# Patient Record
Sex: Female | Born: 1990 | Race: White | Hispanic: No | Marital: Single | Smoking: Current every day smoker
Health system: Southern US, Community
[De-identification: ages and names within clinical notes are randomized; demographics above are authoritative.]

---

## 2019-05-26 ENCOUNTER — Encounter (HOSPITAL_COMMUNITY): Payer: Self-pay

## 2019-05-26 ENCOUNTER — Other Ambulatory Visit: Payer: Self-pay

## 2019-05-26 ENCOUNTER — Emergency Department (HOSPITAL_COMMUNITY): Payer: Self-pay

## 2019-05-26 ENCOUNTER — Emergency Department (HOSPITAL_COMMUNITY)
Admission: EM | Admit: 2019-05-26 | Discharge: 2019-05-27 | Disposition: A | Discharge status: Home / Self Care | Payer: Self-pay | Attending: Emergency Medicine | Admitting: Emergency Medicine

## 2019-05-26 DIAGNOSIS — F17210 Nicotine dependence, cigarettes, uncomplicated: Secondary | ICD-10-CM | POA: Insufficient documentation

## 2019-05-26 DIAGNOSIS — R030 Elevated blood-pressure reading, without diagnosis of hypertension: Secondary | ICD-10-CM | POA: Insufficient documentation

## 2019-05-26 DIAGNOSIS — R000 Tachycardia, unspecified: Secondary | ICD-10-CM | POA: Insufficient documentation

## 2019-05-26 DIAGNOSIS — I10 Essential (primary) hypertension: Secondary | ICD-10-CM

## 2019-05-26 DIAGNOSIS — Z87442 Personal history of urinary calculi: Secondary | ICD-10-CM | POA: Insufficient documentation

## 2019-05-26 DIAGNOSIS — N3001 Acute cystitis with hematuria: Secondary | ICD-10-CM | POA: Insufficient documentation

## 2019-05-26 LAB — CBC WITH DIFFERENTIAL/PLATELET
Abs Immature Granulocytes: 0.07 10*3/uL (ref 0.00–0.07)
Basophils Absolute: 0 10*3/uL (ref 0.0–0.1)
Basophils Relative: 0 %
Eosinophils Absolute: 0.1 10*3/uL (ref 0.0–0.5)
Eosinophils Relative: 0 %
HCT: 45.2 % (ref 36.0–46.0)
Hemoglobin: 13.8 g/dL (ref 12.0–15.0)
Immature Granulocytes: 0 %
Lymphocytes Relative: 11 %
Lymphs Abs: 1.8 10*3/uL (ref 0.7–4.0)
MCH: 25.6 pg — ABNORMAL LOW (ref 26.0–34.0)
MCHC: 30.5 g/dL (ref 30.0–36.0)
MCV: 83.9 fL (ref 80.0–100.0)
Monocytes Absolute: 0.8 10*3/uL (ref 0.1–1.0)
Monocytes Relative: 5 %
Neutro Abs: 14.4 10*3/uL — ABNORMAL HIGH (ref 1.7–7.7)
Neutrophils Relative %: 84 %
Platelets: 392 10*3/uL (ref 150–400)
RBC: 5.39 MIL/uL — ABNORMAL HIGH (ref 3.87–5.11)
RDW: 15.6 % — ABNORMAL HIGH (ref 11.5–15.5)
WBC: 17.2 10*3/uL — ABNORMAL HIGH (ref 4.0–10.5)
nRBC: 0 % (ref 0.0–0.2)

## 2019-05-26 LAB — URINALYSIS, ROUTINE W REFLEX MICROSCOPIC
Bilirubin Urine: NEGATIVE
Glucose, UA: NEGATIVE mg/dL
Ketones, ur: 5 mg/dL — AB
Nitrite: POSITIVE — AB
Protein, ur: 100 mg/dL — AB
RBC / HPF: >50 RBC/hpf — ABNORMAL HIGH (ref 0–5)
Specific Gravity, Urine: 1.011 (ref 1.005–1.030)
WBC, UA: >50 WBC/hpf — ABNORMAL HIGH (ref 0–5)
pH: 6 (ref 5.0–8.0)

## 2019-05-26 LAB — POC URINE PREG, ED: Preg Test, Ur: NEGATIVE

## 2019-05-26 LAB — COMPREHENSIVE METABOLIC PANEL
ALT: 36 U/L (ref 0–44)
AST: 28 U/L (ref 15–41)
Albumin: 3.8 g/dL (ref 3.5–5.0)
Alkaline Phosphatase: 100 U/L (ref 38–126)
Anion gap: 13 (ref 5–15)
BUN: 7 mg/dL (ref 6–20)
CO2: 20 mmol/L — ABNORMAL LOW (ref 22–32)
Calcium: 9.2 mg/dL (ref 8.9–10.3)
Chloride: 105 mmol/L (ref 98–111)
Creatinine, Ser: 0.8 mg/dL (ref 0.44–1.00)
GFR calc Af Amer: >60 mL/min (ref >60)
GFR calc non Af Amer: >60 mL/min (ref >60)
Glucose, Bld: 158 mg/dL — ABNORMAL HIGH (ref 70–99)
Potassium: 3.9 mmol/L (ref 3.5–5.1)
Sodium: 138 mmol/L (ref 135–145)
Total Bilirubin: 0.6 mg/dL (ref 0.3–1.2)
Total Protein: 8.2 g/dL — ABNORMAL HIGH (ref 6.5–8.1)

## 2019-05-26 LAB — LIPASE, BLOOD: Lipase: 16 U/L (ref 11–51)

## 2019-05-26 LAB — I-STAT BETA HCG BLOOD, ED (MC, WL, AP ONLY): I-stat hCG, quantitative: <5 m[IU]/mL (ref <5)

## 2019-05-26 MED ORDER — SODIUM CHLORIDE 0.9 % IV BOLUS: 1000.0000 mL | Freq: Once | INTRAVENOUS | Status: DC

## 2019-05-26 MED ORDER — SODIUM CHLORIDE 0.9 % IV SOLN: 1.0000 g | Freq: Once | INTRAVENOUS | Status: DC

## 2019-05-26 MED ORDER — CIPROFLOXACIN HCL 500 MG PO TABS
500.0000 mg | ORAL_TABLET | Freq: Once | ORAL | Status: AC
  Administered 2019-05-26: 500 mg via ORAL
  Filled 2019-05-26: qty 1

## 2019-05-26 MED ORDER — IBUPROFEN 800 MG PO TABS
800.0000 mg | ORAL_TABLET | Freq: Once | ORAL | Status: AC
  Administered 2019-05-26: 800 mg via ORAL
  Filled 2019-05-26: qty 1

## 2019-05-26 NOTE — ED Triage Notes (Signed)
Pt complains of flank pain that she says "switches sides." She reports a history of kidney stones and UTIs. Endorses frequent urination starting yesterday. A&Ox4. Denies N/V.

## 2019-05-26 NOTE — ED Provider Notes (Signed)
Lindsey Brennan   CSN: [id number] Arrival date & time: 05/26/19  2024     History Chief Complaint  Patient presents with  . Flank Pain    Lindsey Brennan is a 29 y.o. female presents for evaluation of acute onset, intermittent flank pain and urinary symptoms.  Reports that her UTI symptoms began 2 days ago with dysuria, urinary urgency and frequency.  She denies hematuria.  Yesterday developed aching pains to the bilateral flanks which alternates sides.  It is intermittent, states that she sometimes feels as though she cannot get comfortable.  It does not worsen with position changes.  Denies nausea or vomiting.  No fevers or chills.  Denies shortness of breath, chest pain, or cough.  She tried over-the-counter headache medicine that contains Tylenol with complete resolution of her symptoms.  States that she had a kidney stone 3 years ago but never followed up with urology.  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  She does tell me that her baseline heart rate is around 120 bpm.  Does not currently have a PCP.  She smokes 4 to 5 cigarettes daily.  The history is provided by the patient.       History reviewed. No pertinent past medical history.  There are no problems to display for this patient.   OB History   No obstetric history on file.     History reviewed. No pertinent family history.  Social History   Tobacco Use  . Smoking status: Not on file  Substance Use Topics  . Alcohol use: Not on file  . Drug use: Not on file    Home Medications Prior to Admission medications   Medication Sig Start Date End Date Taking? Authorizing Provider  acetaminophen (TYLENOL) 500 MG tablet Take 1 tablet (500 mg total) by mouth every 6 (six) hours as needed. 05/27/19   Fleming Prill A,  PA-C  ciprofloxacin (CIPRO) 500 MG tablet Take 1 tablet (500 mg total) by mouth every 12 (twelve) hours for 5 days. 05/27/19 06/01/19  Rodell Perna A, PA-C  ibuprofen (ADVIL) 600 MG tablet Take 1 tablet (600 mg total) by mouth every 6 (six) hours as needed. 05/27/19   Rodell Perna A, PA-C    Allergies    Patient has no known allergies.  Review of Systems   Review of Systems  Constitutional: Negative for chills and fever.  Respiratory: Negative for cough and shortness of breath.   Cardiovascular: Negative for chest pain.  Gastrointestinal: Positive for abdominal pain. Negative for diarrhea, nausea and vomiting.  Genitourinary: Positive for dysuria, flank pain, frequency and urgency. Negative for hematuria.  Psychiatric/Behavioral: The patient is nervous/anxious.   All other systems reviewed and are negative.   Physical Exam Updated Vital Signs BP 137/81   Pulse (!) 135   Temp 99.2 F (37.3 C) (Oral)   Resp 15   Ht 5\' 7"  (1.702 m)   Wt (!) 191 kg   SpO2 98%   BMI 65.94 kg/m   Physical Exam Vitals and nursing Brennan reviewed.  Constitutional:      General: She is not in acute distress.    Appearance: She is well-developed. She is obese.     Comments: Morbidly obese female. Appears quite anxious, initially tearful but able to be consoled  HENT:  Head: Normocephalic and atraumatic.  Eyes:     General:        Right eye: No discharge.        Left eye: No discharge.     Conjunctiva/sclera: Conjunctivae normal.  Neck:     Vascular: No JVD.     Trachea: No tracheal deviation.  Cardiovascular:     Rate and Rhythm: Regular rhythm. Tachycardia present.     Heart sounds: Normal heart sounds.  Pulmonary:     Effort: Pulmonary effort is normal.     Breath sounds: Normal breath sounds.  Abdominal:     General: Bowel sounds are normal. There is no distension.     Palpations: Abdomen is soft.     Tenderness: There is no right CVA tenderness, left CVA tenderness, guarding or rebound.   Musculoskeletal:     Cervical back: Neck supple.     Comments: No midline lumbar spine tenderness to palpation, no paraspinal muscle tenderness, no deformity, crepitus, or step-off noted   Skin:    General: Skin is warm and dry.     Findings: No erythema.  Neurological:     Mental Status: She is alert.  Psychiatric:        Behavior: Behavior normal.     ED Results / Procedures / Treatments   Labs (all labs ordered are listed, but only abnormal results are displayed) Labs Reviewed  URINE CULTURE - Abnormal; Notable for the following components:      Result Value   Culture   (*)    Value: >=100,000 COLONIES/mL ESCHERICHIA COLI SUSCEPTIBILITIES TO FOLLOW Performed at [facility], [address] [place], [place] [address]    All other components within normal limits  URINALYSIS, ROUTINE W REFLEX MICROSCOPIC - Abnormal; Notable for the following components:   APPearance CLOUDY (*)    Hgb urine dipstick LARGE (*)    Ketones, ur 5 (*)    Protein, ur 100 (*)    Nitrite POSITIVE (*)    Leukocytes,Ua LARGE (*)    RBC / HPF >50 (*)    WBC, UA >50 (*)    Bacteria, UA MANY (*)    Non Squamous Epithelial 0-5 (*)    All other components within normal limits  CBC WITH DIFFERENTIAL/PLATELET - Abnormal; Notable for the following components:   WBC 17.2 (*)    RBC 5.39 (*)    MCH 25.6 (*)    RDW 15.6 (*)    Neutro Abs 14.4 (*)    All other components within normal limits  COMPREHENSIVE METABOLIC PANEL - Abnormal; Notable for the following components:   CO2 20 (*)    Glucose, Bld 158 (*)    Total Protein 8.2 (*)    All other components within normal limits  LIPASE, BLOOD  POC URINE PREG, ED  I-STAT BETA HCG BLOOD, ED ([facility], [facility], [facility] ONLY)    EKG None  Radiology CT Renal Stone Study  Result Date: 05/26/2019 CLINICAL DATA:  Flank pain. EXAM: CT ABDOMEN AND PELVIS WITHOUT CONTRAST TECHNIQUE: Multidetector CT imaging of the abdomen and pelvis was performed following the  standard protocol without IV contrast. COMPARISON:  None. FINDINGS: Lower chest: The lung bases are clear. The heart size is normal. Hepatobiliary: There is decreased hepatic attenuation suggestive of hepatic steatosis. Normal gallbladder.There is no biliary ductal dilation. Pancreas: Normal contours without ductal dilatation. No peripancreatic fluid collection. Spleen: No splenic laceration or hematoma. Adrenals/Urinary Tract: --Adrenal glands: No adrenal hemorrhage. --Right kidney/ureter: There may  be some wall thickening of the distal right ureter without evidence for hydronephrosis. --Left kidney/ureter: There appears to be some wall thickening of the distal left ureter without evidence for hydronephrosis. --Urinary bladder: There is mild wall thickening of the urinary bladder with mild adjacent inflammatory changes. Stomach/Bowel: --Stomach/Duodenum: No hiatal hernia or other gastric abnormality. Normal duodenal course and caliber. --Small bowel: No dilatation or inflammation. --Colon: No focal abnormality. --Appendix: Normal. Vascular/Lymphatic: Normal course and caliber of the major abdominal vessels. --No retroperitoneal lymphadenopathy. --No mesenteric lymphadenopathy. --No pelvic or inguinal lymphadenopathy. Reproductive: Unremarkable Other: No ascites or free air. The abdominal wall is normal. Musculoskeletal. No acute displaced fractures. IMPRESSION: 1. No hydronephrosis.  No radiopaque kidney stones. 2. Wall thickening and fat stranding about the distal ureters and urinary bladder is suspicious for an ascending urinary tract infection. Correlation with urinalysis is recommended. 3. Hepatic steatosis. Electronically Signed   By: Katherine Mantle M.D.   On: 05/26/2019 23:04    Procedures Procedures (including critical care time)  Medications Ordered in ED Medications  ciprofloxacin (CIPRO) tablet 500 mg (500 mg Oral Given 05/26/19 2344)  ibuprofen (ADVIL) tablet 800 mg (800 mg Oral Given 05/26/19  2344)    ED Course  I have reviewed the triage vital signs and the nursing notes.  Pertinent labs & imaging results that were available during my care of the patient were reviewed by me and considered in my medical decision making (see chart for details).    MDM Rules/Calculators/A&P                      Patient presenting for evaluation of urinary symptoms and bilateral flank pain.  She is afebrile, persistently tachycardic in the ED and initially hypertensive but states that her baseline heart rate is in the 120s and she is frequently hypertensive due to anxiety in medical settings.  She is nontoxic in appearance.  Abdomen is soft with no peritoneal signs.  Lab work reviewed and interpreted by myself shows a leukocytosis of 17.2, no anemia, no metabolic derangements, no renal insufficiency.  Her UA is concerning for UTI, will culture.  Renal stone study was obtained which shows no evidence of nephrolithiasis but she does have evidence of a sending urinary tract infection.  She was given Cipro in the ED.  On reevaluation she is resting comfortably, tolerating p.o. fluids without difficulty.  Serial abdominal examinations remain benign.  Doubt acute surgical abdominal pathology such as obstruction, perforation, appendicitis, cholecystitis, TOA, PID, ovarian torsion, or ectopic pregnancy.  We also discussed the importance of follow-up with a PCP to establish care and follow-up on her history of hypertension and tachycardia.  Her heart rate has improved here.  She was given resources for follow-up with a PCP outpatient.  Discussed strict ED return precautions. Patient verbalized understanding of and agreement with plan and is safe for discharge home at this time.    Final Clinical Impression(s) / ED Diagnoses Final diagnoses:  Acute cystitis with hematuria  Tachycardia  Hypertension, unspecified type    Rx / DC Orders ED Discharge Orders         Ordered    ciprofloxacin (CIPRO) 500 MG tablet   Every 12 hours     05/27/19 0006    ibuprofen (ADVIL) 600 MG tablet  Every 6 hours PRN     05/27/19 0006    acetaminophen (TYLENOL) 500 MG tablet  Every 6 hours PRN     05/27/19 0006  Jeanie Sewer, PA-C 05/28/19 2040    Terald Sleeper, MD 05/29/19 [phone]

## 2019-05-26 NOTE — ED Notes (Signed)
Pt reports "white coat syndrome." She states that her vitals are often elevated when she is at a healthcare facility.

## 2019-05-27 MED ORDER — IBUPROFEN 600 MG PO TABS: 600.0000 mg | ORAL_TABLET | Freq: Four times a day (QID) | ORAL | 0 refills | Status: AC | PRN

## 2019-05-27 MED ORDER — ACETAMINOPHEN 500 MG PO TABS: 500.0000 mg | ORAL_TABLET | Freq: Four times a day (QID) | ORAL | 0 refills | Status: AC | PRN

## 2019-05-27 MED ORDER — CIPROFLOXACIN HCL 500 MG PO TABS: 500.0000 mg | ORAL_TABLET | Freq: Two times a day (BID) | ORAL | 0 refills | Status: AC

## 2019-05-27 NOTE — Discharge Instructions (Addendum)
Please take all of your antibiotics until finished!   Take your antibiotics with food.  Common side effects of antibiotics include nausea, vomiting, abdominal discomfort, and diarrhea. You may help offset some of this with probiotics which you can buy or get in yogurt. Do not eat  or take the probiotics until 2 hours after your antibiotic.    Some studies suggest that certain antibiotics can reduce the efficacy of certain oral contraceptive pills (birth control), so please use additional contraceptives (condoms or other barrier method) while you are taking the antibiotics and for an additional 5 to 7 days afterwards if you are a female on these medications.  You can take 1 to 2 tablets of Tylenol (350mg -1000mg  depending on the dose) every 6 hours as needed for pain.  Do not exceed 4000 mg of Tylenol daily.  If your pain persists you can take a dose of ibuprofen in between doses of Tylenol.  I usually recommend 400 to 600 mg of ibuprofen every 6 hours.  Take this with food to avoid upset stomach issues.  Follow-up with [facility] to establish primary care services and reevaluate your high blood pressures and fast heart rate.  They also have financial advisor's that can help with further medical care.  In the meantime you can download the good Rx app on your phone to compare prices of medications.  Return to the emergency department immediately if any concerning signs or symptoms develop such as high fevers, persistent vomiting, loss of consciousness, or severe uncontrolled pain

## 2019-05-28 ENCOUNTER — Other Ambulatory Visit: Payer: Self-pay

## 2019-05-28 ENCOUNTER — Encounter (HOSPITAL_COMMUNITY): Payer: Self-pay | Admitting: Emergency Medicine

## 2019-05-28 ENCOUNTER — Emergency Department (HOSPITAL_COMMUNITY)
Admission: EM | Admit: 2019-05-28 | Discharge: 2019-05-28 | Disposition: A | Discharge status: Home / Self Care | Payer: Self-pay | Attending: Emergency Medicine | Admitting: Emergency Medicine

## 2019-05-28 DIAGNOSIS — F17210 Nicotine dependence, cigarettes, uncomplicated: Secondary | ICD-10-CM | POA: Insufficient documentation

## 2019-05-28 DIAGNOSIS — N390 Urinary tract infection, site not specified: Secondary | ICD-10-CM | POA: Insufficient documentation

## 2019-05-28 DIAGNOSIS — T50905A Adverse effect of unspecified drugs, medicaments and biological substances, initial encounter: Secondary | ICD-10-CM | POA: Insufficient documentation

## 2019-05-28 DIAGNOSIS — T887XXA Unspecified adverse effect of drug or medicament, initial encounter: Secondary | ICD-10-CM | POA: Insufficient documentation

## 2019-05-28 MED ORDER — CEPHALEXIN 500 MG PO CAPS: 500.0000 mg | ORAL_CAPSULE | Freq: Four times a day (QID) | ORAL | 0 refills | Status: AC

## 2019-05-28 NOTE — Discharge Instructions (Addendum)
Stop taking Cipro.  Begin taking Keflex as prescribed.  Follow-up with primary doctor if not improving in the next few days, and return to the ER if your symptoms significantly worsen or change.

## 2019-05-28 NOTE — ED Provider Notes (Signed)
[facility] DEPT Provider Note   CSN: [id number] Arrival date & time: 05/28/19  2038     History Chief Complaint  Patient presents with  . Medication Reaction    Lindsey Brennan is a 29 y.o. female.  Patient is a 29 year old female with history of prior UTIs, obesity.  She presents today for evaluation of headache, increased thirst, and continued low back discomfort.  She was diagnosed with a UTI 2 days ago and treated with Cipro.  She believes that she is having a reaction to this medication.  She denies any hives or throat swelling.  She denies any fevers or chills.  The history is provided by the patient.       History reviewed. No pertinent past medical history.  There are no problems to display for this patient.   History reviewed. No pertinent surgical history.   OB History   No obstetric history on file.     History reviewed. No pertinent family history.  Social History   Tobacco Use  . Smoking status: Current Every Day Smoker  . Smokeless tobacco: Never Used  Substance Use Topics  . Alcohol use: Never  . Drug use: Never    Home Medications Prior to Admission medications   Medication Sig Start Date End Date Taking? Authorizing Provider  acetaminophen (TYLENOL) 500 MG tablet Take 1 tablet (500 mg total) by mouth every 6 (six) hours as needed. 05/27/19   Fawze, Mina A, PA-C  ciprofloxacin (CIPRO) 500 MG tablet Take 1 tablet (500 mg total) by mouth every 12 (twelve) hours for 5 days. 05/27/19 06/01/19  Rodell Perna A, PA-C  ibuprofen (ADVIL) 600 MG tablet Take 1 tablet (600 mg total) by mouth every 6 (six) hours as needed. 05/27/19   Rodell Perna A, PA-C    Allergies    Patient has no known allergies.  Review of Systems   Review of Systems  All other systems reviewed and are negative.   Physical Exam Updated Vital Signs BP 100/74 (BP Location: Left Arm)   Pulse (!) 132   Temp 98.3 F (36.8 C) (Oral)   Resp 17   Ht 7\' 5"  (2.261 m)    Wt (!) 191 kg   LMP 04/29/2019   SpO2 94%   BMI 37.37 kg/m   Physical Exam Vitals and nursing note reviewed.  Constitutional:      General: She is not in acute distress.    Appearance: She is well-developed. She is not diaphoretic.  HENT:     Head: Normocephalic and atraumatic.  Cardiovascular:     Rate and Rhythm: Normal rate and regular rhythm.     Heart sounds: No murmur. No friction rub. No gallop.   Pulmonary:     Effort: Pulmonary effort is normal. No respiratory distress.     Breath sounds: Normal breath sounds. No wheezing.  Abdominal:     General: Bowel sounds are normal. There is no distension.     Palpations: Abdomen is soft.     Tenderness: There is no abdominal tenderness.  Musculoskeletal:        General: Normal range of motion.     Cervical back: Normal range of motion and neck supple.  Skin:    General: Skin is warm and dry.  Neurological:     Mental Status: She is alert and oriented to person, place, and time.     ED Results / Procedures / Treatments   Labs (all labs ordered are listed, but only  abnormal results are displayed) Labs Reviewed - No data to display  EKG None  Radiology CT Renal Stone Study  Result Date: 05/26/2019 CLINICAL DATA:  Flank pain. EXAM: CT ABDOMEN AND PELVIS WITHOUT CONTRAST TECHNIQUE: Multidetector CT imaging of the abdomen and pelvis was performed following the standard protocol without IV contrast. COMPARISON:  None. FINDINGS: Lower chest: The lung bases are clear. The heart size is normal. Hepatobiliary: There is decreased hepatic attenuation suggestive of hepatic steatosis. Normal gallbladder.There is no biliary ductal dilation. Pancreas: Normal contours without ductal dilatation. No peripancreatic fluid collection. Spleen: No splenic laceration or hematoma. Adrenals/Urinary Tract: --Adrenal glands: No adrenal hemorrhage. --Right kidney/ureter: There may be some wall thickening of the distal right ureter without evidence for  hydronephrosis. --Left kidney/ureter: There appears to be some wall thickening of the distal left ureter without evidence for hydronephrosis. --Urinary bladder: There is mild wall thickening of the urinary bladder with mild adjacent inflammatory changes. Stomach/Bowel: --Stomach/Duodenum: No hiatal hernia or other gastric abnormality. Normal duodenal course and caliber. --Small bowel: No dilatation or inflammation. --Colon: No focal abnormality. --Appendix: Normal. Vascular/Lymphatic: Normal course and caliber of the major abdominal vessels. --No retroperitoneal lymphadenopathy. --No mesenteric lymphadenopathy. --No pelvic or inguinal lymphadenopathy. Reproductive: Unremarkable Other: No ascites or free air. The abdominal wall is normal. Musculoskeletal. No acute displaced fractures. IMPRESSION: 1. No hydronephrosis.  No radiopaque kidney stones. 2. Wall thickening and fat stranding about the distal ureters and urinary bladder is suspicious for an ascending urinary tract infection. Correlation with urinalysis is recommended. 3. Hepatic steatosis. Electronically Signed   By: Katherine Mantle M.D.   On: 05/26/2019 23:04    Procedures Procedures (including critical care time)  Medications Ordered in ED Medications - No data to display  ED Course  I have reviewed the triage vital signs and the nursing notes.  Pertinent labs & imaging results that were available during my care of the patient were reviewed by me and considered in my medical decision making (see chart for details).    MDM Rules/Calculators/A&P  Patient presenting with complaints as described in the HPI.  She is concerned she is having a reaction to Cipro.  Patient's vital signs are stable and her physical examination is unremarkable.  Upon reviewing her medical records from 2 days ago, UA was consistent with a UTI, she had a white count of 18,000, and a CT scan showing bladder wall thickening and fat stranding around the distal ureters  consistent with a urinary tract infection.  Vital signs today are stable and patient is afebrile.  She is nontoxic-appearing.  Patient is requesting a different antibiotic.  I will change her Cipro to Keflex and have her follow-up as needed.  Final Clinical Impression(s) / ED Diagnoses Final diagnoses:  None    Rx / DC Orders ED Discharge Orders    None       Geoffery Lyons, MD 05/28/19 2115

## 2019-05-28 NOTE — ED Triage Notes (Signed)
Patient is complaining of having a medication reaction to ciprofloxacin (CIPRO) 500 MG tablet. Patient states that the first day it gave her tremors 05/27/19. Patient states today she has a headache, really thirsty, and right lower back pain.

## 2019-05-29 LAB — URINE CULTURE: Culture: >=100000 — AB

## 2019-05-30 ENCOUNTER — Telehealth: Payer: Self-pay | Admitting: *Deleted

## 2019-05-30 NOTE — Telephone Encounter (Signed)
Post ED Visit - Positive Culture Follow-up  Culture report reviewed by antimicrobial stewardship pharmacist: [facility] []  , Pharm.D. []  Enzo Bi, Pharm.D., BCPS AQ-ID []  , Pharm.D., BCPS []  Celedonio Miyamoto, .D., BCPS []  [place], .D., BCPS, AAHIVP []  Georgina Pillion, Pharm.D., BCPS, AAHIVP []  [address], PharmD, BCPS []  , PharmD, BCPS []  [place], PharmD, BCPS []  [place], PharmD []  , PharmD, BCPS []  Estella Husk, PharmD  Pharmacy Team []  Lysle Pearl, PharmD []  , PharmD []  Phillips Climes, PharmD []  , Rph []  Agapito Games) , PharmD []  Verlan Friends, PharmD []  , PharmD []  Mervyn Gay, PharmD []  , PharmD []  Vinnie Level, PharmD []  [facility], PharmD []  , PharmD []  Len Childs, PharmD  , PharmD  Positive urine culture Treated with Ciprofloxacin, organism sensitive to the same and no further patient follow-up is required at this time.  Greer Pickerel [facility] 05/30/2019, 10:41 AM

## 2019-05-30 NOTE — Telephone Encounter (Signed)
Post ED Visit - Positive Culture Follow-up  Culture report reviewed by antimicrobial stewardship pharmacist: [facility] []  , Pharm.D. []  Enzo Bi, Pharm.D., BCPS AQ-ID []  , Pharm.D., BCPS []  Celedonio Miyamoto, Pharm.D., BCPS []  [place], Garvin Fila.D., BCPS, AAHIVP []  , Pharm.D., BCPS, AAHIVP []  Georgina Pillion, PharmD, BCPS []  , PharmD, BCPS []  [place], PharmD, BCPS []  [address], PharmD []  , PharmD, BCPS []  Estella Husk, PharmD  Pharmacy Team []  Lysle Pearl, PharmD []  , PharmD []  Phillips Climes, PharmD []  , Rph []  [employer]) , PharmD []  Verlan Friends, PharmD []  , PharmD []  Mervyn Gay, PharmD []  , PharmD []  Vinnie Level, PharmD []  [facility], PharmD []  , PharmD []  Len Childs, PharmD  , PharmD  Positive urine culture Treated with Ciprofloxacin HCL, organism sensitive to the same and no further patient follow-up is required at this time.  Greer Pickerel [facility] 05/30/2019, 9:54 AM

## 2020-07-18 IMAGING — CT CT RENAL STONE PROTOCOL
2 of 4 series · 16 of 46 positions shown, 18 images · non-contrast
Comparison: None.

CLINICAL DATA: Flank pain.

EXAM:
CT ABDOMEN AND PELVIS WITHOUT CONTRAST
TECHNIQUE: Multidetector CT imaging of the abdomen and pelvis was performed
following the standard protocol without IV contrast.

[Series 2: axial st · axial · 0.84mm/px · z∈[-625,-180]mm · 13 of 103 slices shown, 15 images]
[im 7/103  soft-tissue]
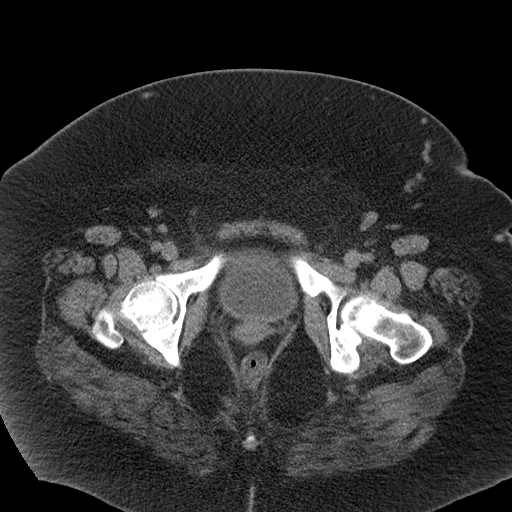
[im 7/103  bone]
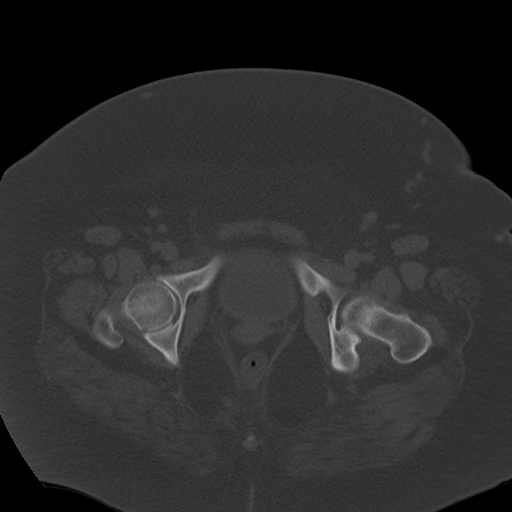
[im 13/103  soft-tissue]
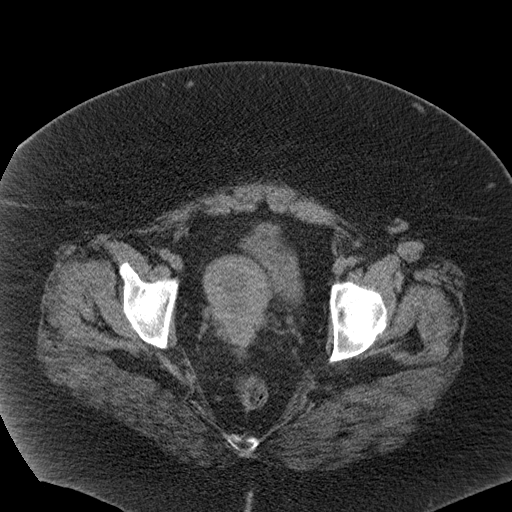
[im 20/103  soft-tissue]
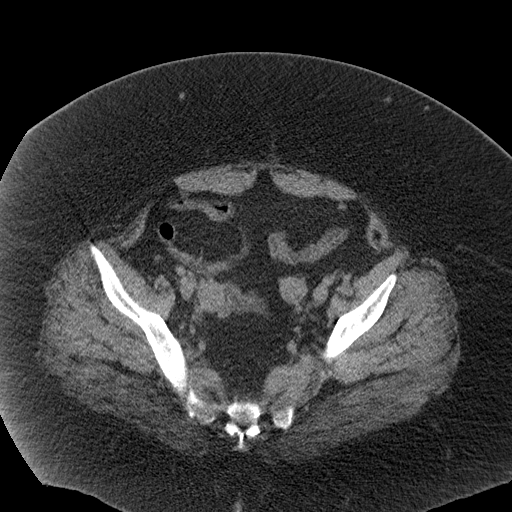
[im 32/103  soft-tissue]
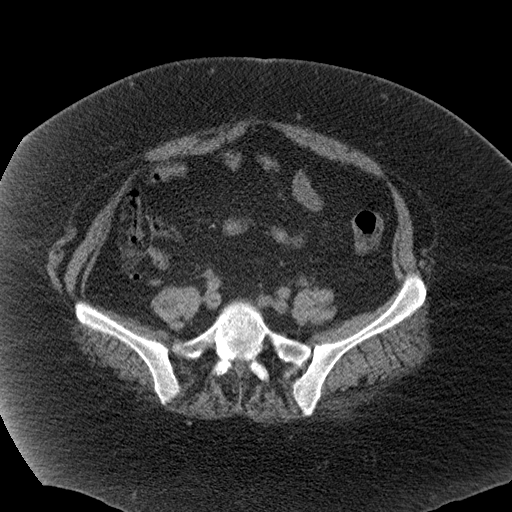
[im 39/103  soft-tissue]
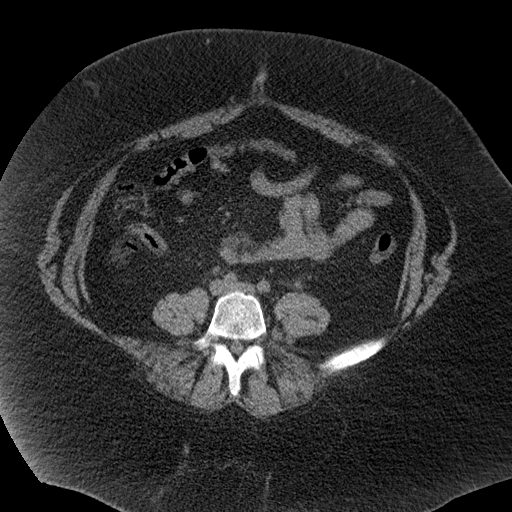
[im 45/103  soft-tissue]
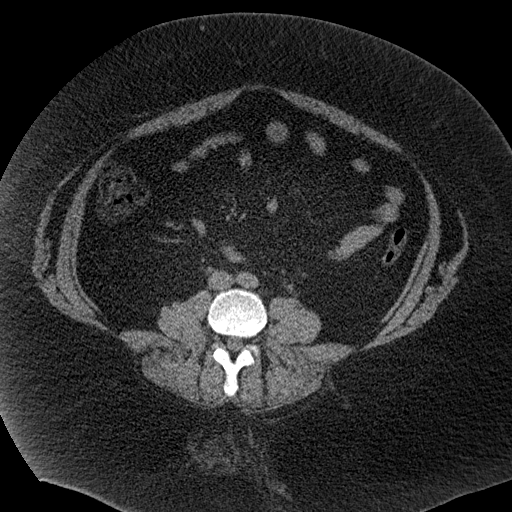
[im 52/103  soft-tissue]
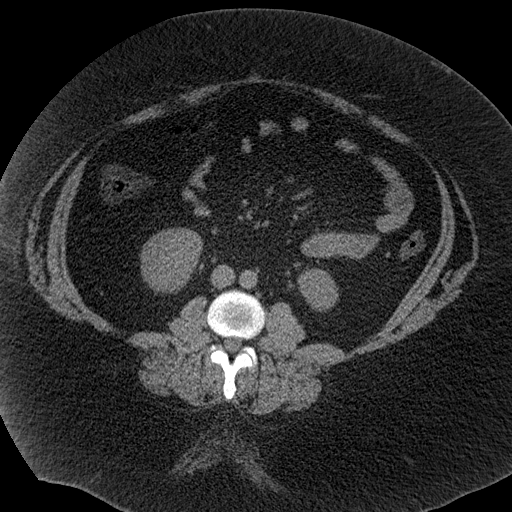
[im 58/103  soft-tissue]
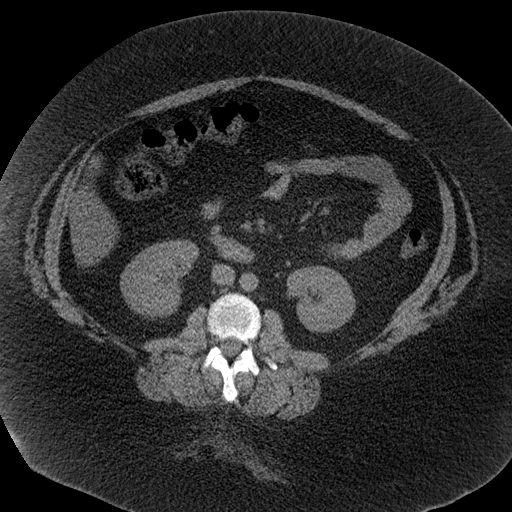
[im 64/103  soft-tissue]
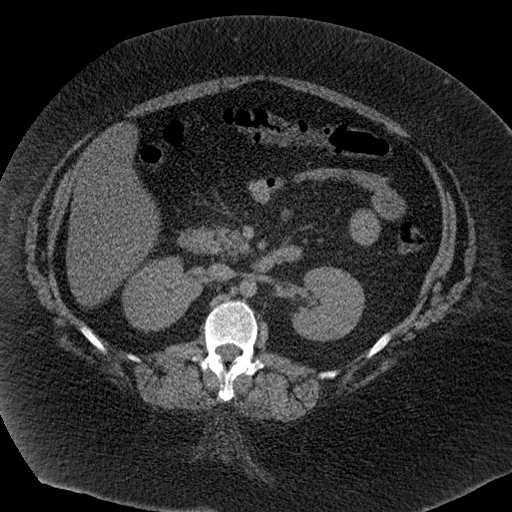
[im 64/103  bone]
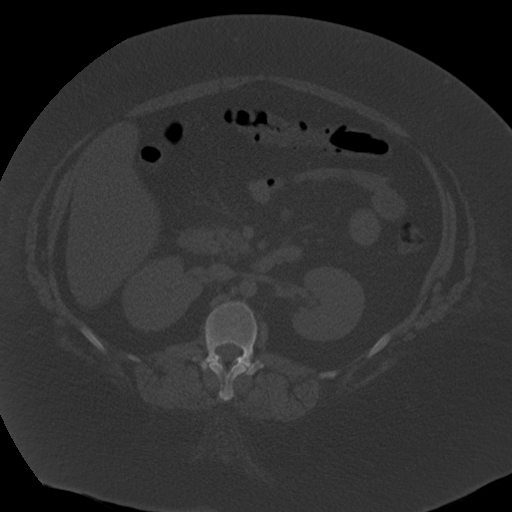
[im 71/103  soft-tissue]
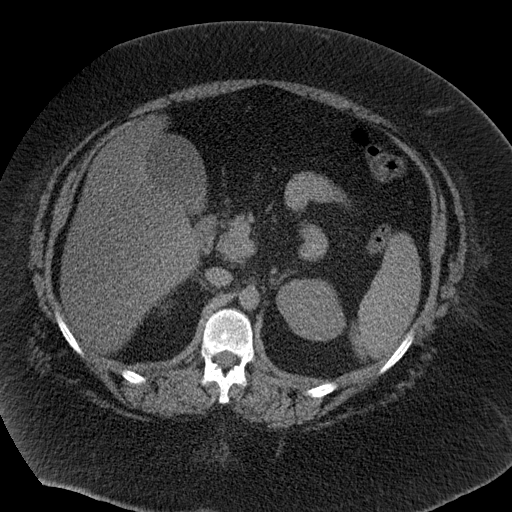
[im 83/103  soft-tissue]
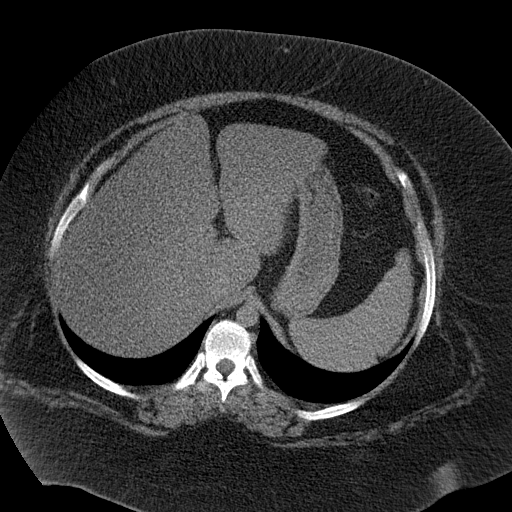
[im 90/103  soft-tissue]
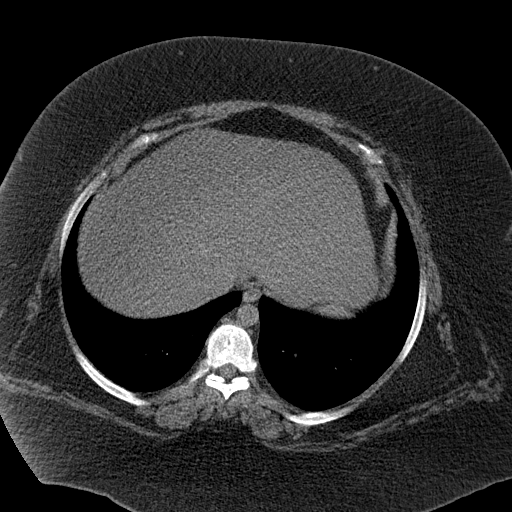
[im 96/103  soft-tissue]
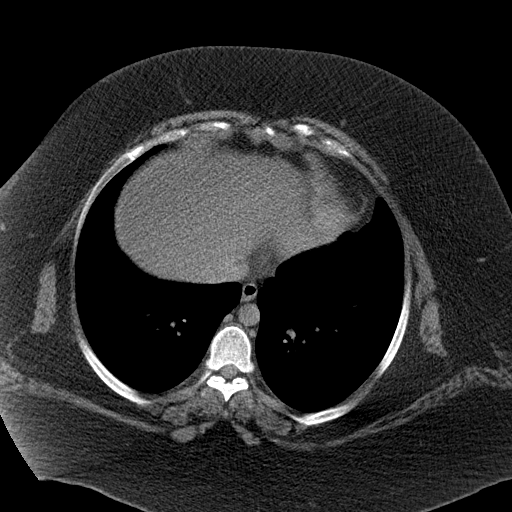

[Series 5: coronal · coronal · 0.94mm/px · 3 of 182 slices shown]
[im 61/182  soft-tissue]
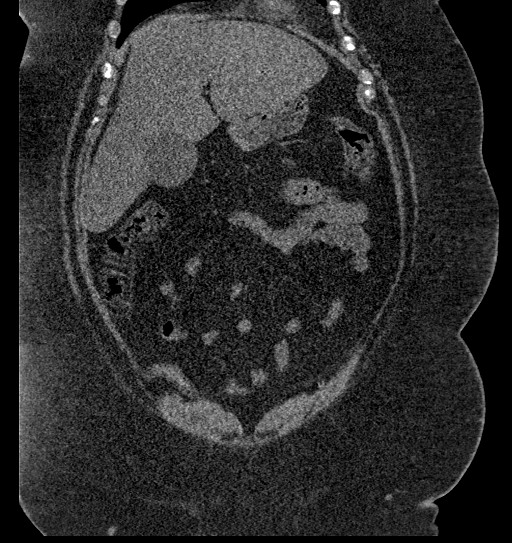
[im 81/182  soft-tissue]
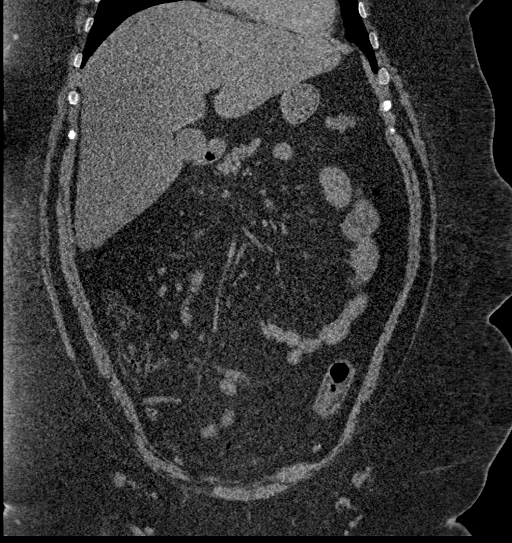
[im 101/182  soft-tissue]
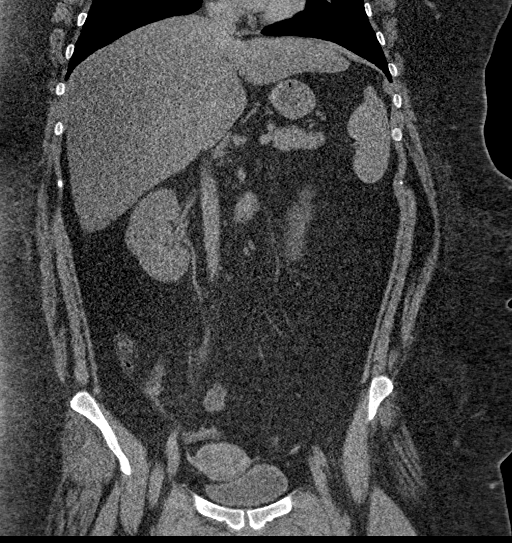

[16 of 46 positions shown; findings below may reference images not displayed]

FINDINGS: Lower chest: The lung bases are clear. The heart size is normal.

Hepatobiliary: There is decreased hepatic attenuation suggestive of
hepatic steatosis. Normal gallbladder.There is no biliary ductal
dilation.

Pancreas: Normal contours without ductal dilatation. No
peripancreatic fluid collection.

Spleen: No splenic laceration or hematoma.

Adrenals/Urinary Tract:

--Adrenal glands: No adrenal hemorrhage.

--Right kidney/ureter: There may be some wall thickening of the
distal right ureter without evidence for hydronephrosis.

--Left kidney/ureter: There appears to be some wall thickening of
the distal left ureter without evidence for hydronephrosis.

--Urinary bladder: There is mild wall thickening of the urinary
bladder with mild adjacent inflammatory changes.

Stomach/Bowel:

--Stomach/Duodenum: No hiatal hernia or other gastric abnormality.
Normal duodenal course and caliber.

--Small bowel: No dilatation or inflammation.

--Colon: No focal abnormality.

--Appendix: Normal.

Vascular/Lymphatic: Normal course and caliber of the major abdominal
vessels.

--No retroperitoneal lymphadenopathy.

--No mesenteric lymphadenopathy.

--No pelvic or inguinal lymphadenopathy.

Reproductive: Unremarkable

Other: No ascites or free air. The abdominal wall is normal.

Musculoskeletal. No acute displaced fractures.
IMPRESSION: 1. No hydronephrosis.  No radiopaque kidney stones.
2. Wall thickening and fat stranding about the distal ureters and
urinary bladder is suspicious for an ascending urinary tract
infection. Correlation with urinalysis is recommended.
3. Hepatic steatosis.
